# Patient Record
Sex: Female | Born: 2005 | Race: Black or African American | Hispanic: No | Marital: Single
Health system: Southern US, Community
[De-identification: ages and names within clinical notes are randomized; demographics above are authoritative.]

## PROBLEM LIST (undated history)

## (undated) DIAGNOSIS — J45909 Unspecified asthma, uncomplicated: Secondary | ICD-10-CM

## (undated) DIAGNOSIS — L309 Dermatitis, unspecified: Secondary | ICD-10-CM

## (undated) DIAGNOSIS — J309 Allergic rhinitis, unspecified: Secondary | ICD-10-CM

## (undated) DIAGNOSIS — Z91010 Allergy to peanuts: Secondary | ICD-10-CM

## (undated) HISTORY — DX: Unspecified asthma, uncomplicated: J45.909

## (undated) HISTORY — DX: Allergy to peanuts: Z91.010

## (undated) HISTORY — DX: Dermatitis, unspecified: L30.9

## (undated) HISTORY — PX: TONSILLECTOMY: SUR1361

## (undated) HISTORY — DX: Allergic rhinitis, unspecified: J30.9

## (undated) HISTORY — PX: ADENOIDECTOMY: SUR15

---
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
## 2025-01-05 ENCOUNTER — Ambulatory Visit
